# Patient Record
Sex: Female | Born: 1980 | Race: Black or African American | Hispanic: No | Marital: Married | Smoking: Never smoker
Health system: Southern US, Community
[De-identification: ages and names within clinical notes are randomized; demographics above are authoritative.]

## PROBLEM LIST (undated history)

## (undated) ENCOUNTER — Inpatient Hospital Stay (HOSPITAL_COMMUNITY): Payer: Self-pay

## (undated) DIAGNOSIS — IMO0001 Reserved for inherently not codable concepts without codable children: Secondary | ICD-10-CM

## (undated) DIAGNOSIS — Z789 Other specified health status: Secondary | ICD-10-CM

## (undated) HISTORY — PX: NO PAST SURGERIES: SHX2092

---

## 2005-07-20 ENCOUNTER — Other Ambulatory Visit: Admission: RE | Admit: 2005-07-20 | Discharge: 2005-07-20 | Payer: Self-pay | Admitting: Obstetrics and Gynecology

## 2006-08-04 ENCOUNTER — Other Ambulatory Visit: Admission: RE | Admit: 2006-08-04 | Discharge: 2006-08-04 | Payer: Self-pay | Admitting: Obstetrics and Gynecology

## 2007-08-10 ENCOUNTER — Other Ambulatory Visit: Admission: RE | Admit: 2007-08-10 | Discharge: 2007-08-10 | Payer: Self-pay | Admitting: Obstetrics and Gynecology

## 2008-08-01 ENCOUNTER — Inpatient Hospital Stay (HOSPITAL_COMMUNITY): Admission: AD | Admit: 2008-08-01 | Discharge: 2008-08-01 | Payer: Self-pay | Admitting: Obstetrics and Gynecology

## 2008-08-29 ENCOUNTER — Other Ambulatory Visit: Admission: RE | Admit: 2008-08-29 | Discharge: 2008-08-29 | Payer: Self-pay | Admitting: Obstetrics and Gynecology

## 2009-02-22 ENCOUNTER — Inpatient Hospital Stay (HOSPITAL_COMMUNITY): Admission: AD | Admit: 2009-02-22 | Discharge: 2009-02-25 | Payer: Self-pay | Admitting: Obstetrics and Gynecology

## 2009-10-13 ENCOUNTER — Ambulatory Visit: Admission: RE | Admit: 2009-10-13 | Discharge: 2009-10-13 | Payer: Self-pay | Admitting: Certified Nurse Midwife

## 2010-11-06 ENCOUNTER — Inpatient Hospital Stay (HOSPITAL_COMMUNITY): Admission: AD | Admit: 2010-11-06 | Discharge: 2010-11-08 | Payer: Self-pay | Admitting: Certified Nurse Midwife

## 2011-02-23 LAB — CBC
HCT: 31.9 % — ABNORMAL LOW (ref 36.0–46.0)
Hemoglobin: 10.7 g/dL — ABNORMAL LOW (ref 12.0–15.0)
Hemoglobin: 12.5 g/dL (ref 12.0–15.0)
MCH: 34.6 pg — ABNORMAL HIGH (ref 26.0–34.0)
MCHC: 33.5 g/dL (ref 30.0–36.0)
MCV: 101.8 fL — ABNORMAL HIGH (ref 78.0–100.0)
MCV: 103.3 fL — ABNORMAL HIGH (ref 78.0–100.0)
Platelets: 147 10*3/uL — ABNORMAL LOW (ref 150–400)
Platelets: 170 10*3/uL (ref 150–400)
RBC: 3.08 MIL/uL — ABNORMAL LOW (ref 3.87–5.11)
RBC: 3.6 MIL/uL — ABNORMAL LOW (ref 3.87–5.11)
RDW: 14.1 % (ref 11.5–15.5)
WBC: 10.3 10*3/uL (ref 4.0–10.5)
WBC: 15.6 10*3/uL — ABNORMAL HIGH (ref 4.0–10.5)

## 2011-02-23 LAB — RH IMMUNE GLOB WKUP(>/=20WKS)(NOT WOMEN'S HOSP): Antibody Screen: POSITIVE

## 2011-02-23 LAB — RPR: RPR Ser Ql: NONREACTIVE

## 2011-02-23 LAB — ABO/RH: ABO/RH(D): B NEG

## 2011-03-25 LAB — CBC
HCT: 30.5 % — ABNORMAL LOW (ref 36.0–46.0)
HCT: 30.7 % — ABNORMAL LOW (ref 36.0–46.0)
HCT: 33 % — ABNORMAL LOW (ref 36.0–46.0)
Hemoglobin: 10.1 g/dL — ABNORMAL LOW (ref 12.0–15.0)
Hemoglobin: 10.5 g/dL — ABNORMAL LOW (ref 12.0–15.0)
MCHC: 33.2 g/dL (ref 30.0–36.0)
MCHC: 34.1 g/dL (ref 30.0–36.0)
MCV: 101.7 fL — ABNORMAL HIGH (ref 78.0–100.0)
MCV: 100.4 fL — ABNORMAL HIGH (ref 78.0–100.0)
Platelets: 160 10*3/uL (ref 150–400)
RDW: 14.6 % (ref 11.5–15.5)
RDW: 14.5 % (ref 11.5–15.5)
WBC: 9 10*3/uL (ref 4.0–10.5)

## 2011-03-25 LAB — DIFFERENTIAL
Basophils Relative: 0 % (ref 0–1)
Lymphs Abs: 3.7 10*3/uL (ref 0.7–4.0)
Monocytes Relative: 7 % (ref 3–12)
Neutro Abs: 8.1 10*3/uL — ABNORMAL HIGH (ref 1.7–7.7)
Neutrophils Relative %: 62 % (ref 43–77)

## 2011-03-25 LAB — URIC ACID
Uric Acid, Serum: 5 mg/dL (ref 2.4–7.0)
Uric Acid, Serum: 4.9 mg/dL (ref 2.4–7.0)

## 2011-03-25 LAB — COMPREHENSIVE METABOLIC PANEL
AST: 39 U/L — ABNORMAL HIGH (ref 0–37)
Albumin: 2.8 g/dL — ABNORMAL LOW (ref 3.5–5.2)
Alkaline Phosphatase: 90 U/L (ref 39–117)
Alkaline Phosphatase: 130 U/L — ABNORMAL HIGH (ref 39–117)
BUN: 10 mg/dL (ref 6–23)
BUN: 10 mg/dL (ref 6–23)
BUN: 11 mg/dL (ref 6–23)
Calcium: 8.6 mg/dL (ref 8.4–10.5)
Calcium: 8.4 mg/dL (ref 8.4–10.5)
Chloride: 108 mEq/L (ref 96–112)
Creatinine, Ser: 0.93 mg/dL (ref 0.4–1.2)
Glucose, Bld: 72 mg/dL (ref 70–99)
Glucose, Bld: 76 mg/dL (ref 70–99)
Potassium: 3.5 mEq/L (ref 3.5–5.1)
Total Bilirubin: 0.5 mg/dL (ref 0.3–1.2)
Total Protein: 4.7 g/dL — ABNORMAL LOW (ref 6.0–8.3)
Total Protein: 4.8 g/dL — ABNORMAL LOW (ref 6.0–8.3)

## 2011-03-25 LAB — RPR: RPR Ser Ql: NONREACTIVE

## 2011-07-25 LAB — HEPATITIS B SURFACE ANTIGEN: Hepatitis B Surface Ag: NEGATIVE

## 2011-07-25 LAB — RPR: RPR: NONREACTIVE

## 2011-07-27 LAB — GC/CHLAMYDIA PROBE AMP, GENITAL: Gonorrhea: NEGATIVE

## 2011-12-14 NOTE — L&D Delivery Note (Signed)
Delivery Note   AROM at 0636 - clear / 9cm vtx / ROT at +2 Complete dilation at 0652 Onset of pushing at 0654 FHR second stage baseline 150 - variables to 80 nadir with ctx / pushing  Anesthesia: local for repair only with 1% xylocaine  Delivery of a viable female at 0708 by CNM in LOA position.  Delivery of head between ctx at 0707 then cessation of maternal efforts - McRoberts / coached maternal pushing to delivery shoulders Nuchal Cord tight - slid down body at birth. Passage of meconium at time of birth Cord double clamped after cessation of pulsation, cut by FOB Cord blood sample collected. Apgars 7-9  Placenta delivered 0714 intact with 3 VC.  Placenta for disposal. Uterine tone firm bleeding small  1st laceration asymmetrical right perineal identified.  Repair 4-0 subcuticular repair Est. Blood Loss (mL): 300-350  Complications: none  Mom to postpartum.  Baby to nursery-stable.  Marlinda Mike 02/17/2012, 7:31 AM

## 2011-12-16 ENCOUNTER — Other Ambulatory Visit: Payer: Self-pay | Admitting: Certified Nurse Midwife

## 2011-12-16 DIAGNOSIS — IMO0002 Reserved for concepts with insufficient information to code with codable children: Secondary | ICD-10-CM

## 2011-12-16 MED ORDER — RHO D IMMUNE GLOBULIN 300 MCG IM INJ: 300.0000 ug | INJECTION | Freq: Once | INTRAMUSCULAR | Status: DC

## 2011-12-24 ENCOUNTER — Ambulatory Visit (HOSPITAL_COMMUNITY)
Admission: RE | Admit: 2011-12-24 | Discharge: 2011-12-24 | Disposition: A | Discharge status: Home / Self Care | Payer: Medicaid Other | Source: Ambulatory Visit | Attending: Certified Nurse Midwife | Admitting: Certified Nurse Midwife

## 2011-12-24 ENCOUNTER — Inpatient Hospital Stay (HOSPITAL_COMMUNITY)
Admission: AD | Admit: 2011-12-24 | Discharge: 2011-12-24 | Disposition: A | Discharge status: Home Health | Payer: Medicaid Other | Source: Ambulatory Visit | Attending: Obstetrics | Admitting: Obstetrics

## 2011-12-24 ENCOUNTER — Encounter (HOSPITAL_COMMUNITY): Payer: Self-pay

## 2011-12-24 DIAGNOSIS — O09299 Supervision of pregnancy with other poor reproductive or obstetric history, unspecified trimester: Secondary | ICD-10-CM | POA: Insufficient documentation

## 2011-12-24 DIAGNOSIS — IMO0002 Reserved for concepts with insufficient information to code with codable children: Secondary | ICD-10-CM

## 2011-12-24 DIAGNOSIS — Z2989 Encounter for other specified prophylactic measures: Secondary | ICD-10-CM | POA: Insufficient documentation

## 2011-12-24 DIAGNOSIS — O358XX0 Maternal care for other (suspected) fetal abnormality and damage, not applicable or unspecified: Secondary | ICD-10-CM | POA: Insufficient documentation

## 2011-12-24 DIAGNOSIS — Z298 Encounter for other specified prophylactic measures: Secondary | ICD-10-CM | POA: Insufficient documentation

## 2011-12-24 LAB — RH IG WORKUP (INCLUDES ABO/RH)
ABO/RH(D): B NEG
Antibody Screen: NEGATIVE
Gestational Age(Wks): 36
Unit division: 0

## 2011-12-24 LAB — CBC
HCT: 34 % — ABNORMAL LOW (ref 36.0–46.0)
Hemoglobin: 11.5 g/dL — ABNORMAL LOW (ref 12.0–15.0)
MCH: 32.7 pg (ref 26.0–34.0)
MCHC: 33.8 g/dL (ref 30.0–36.0)
MCV: 96.6 fL (ref 78.0–100.0)
Platelets: 189 10*3/uL (ref 150–400)
RBC: 3.52 MIL/uL — ABNORMAL LOW (ref 3.87–5.11)
RDW: 13.9 % (ref 11.5–15.5)
WBC: 9.5 10*3/uL (ref 4.0–10.5)

## 2011-12-24 LAB — RPR: RPR Ser Ql: NONREACTIVE

## 2011-12-24 LAB — GLUCOSE TOLERANCE, 1 HOUR: Glucose, 1 Hour GTT: 96 mg/dL (ref 70–140)

## 2011-12-24 MED ORDER — RHO D IMMUNE GLOBULIN 1500 UNIT/2ML IJ SOLN
300.0000 ug | Freq: Once | INTRAMUSCULAR | Status: AC
  Administered 2011-12-24: 300 ug via INTRAMUSCULAR

## 2011-12-24 NOTE — Progress Notes (Signed)
Genetic Counseling  High-Risk Gestation Note  Appointment Date:  12/24/2011 Referred By: Marlinda Mike, CNM Date of Birth:  [birth year] Attending: Rema Fendt, MD   Molly Olson was seen for genetic counseling because of the previous ultrasound finding of absent nasal bone.   Ultrasound was performed at the time of today's visit. Complete ultrasound results reported separately.  An absent or hypoplastic (undeveloped, or slightly smaller than expected) nasal bone is commonly a normal variation with no associated problems.  This may be a family trait and can be more common in some ethnic groups, such as the African American population.  However, an absent or hypoplastic nasal bone has been shown to increase the chance for Down syndrome in a pregnancy.   We reviewed chromosomes, nondisjunction, and the common features and variable prognosis of Down syndrome. We discussed that her baseline age-related chance for Down syndrome is approximately 1 in 690. Thus, the finding of absent nasal bone would increase the chance for Down syndrome to approximately 1 in 81. We reviewed the available diagnostic option of amniocentesis.  A risk of 1 in 200-300 was given for amniocentesis, with the primary concern being a risk for preterm labor and delivery.  We discussed another type of screening test, noninvasive prenatal testing (NIPT), which utilizes cell free fetal DNA found in the maternal circulation. This test is not diagnostic for chromosome conditions, but can provide information regarding the presence or absence of extra fetal DNA for chromosomes 13, 18 and 21. The reported detection rate is greater than 99% for Trisomy 21, greater than 97% for Trisomy 18, and is approximately 80% (8 out of 10) for Trisomy 13. The false positive rate is thought to be less than 1% for any of these conditions. We discussed the risks, limitations, and benefits of each. After thoughtful consideration of these options, Mrs. Eberwein  elected to have ultrasound only, and declined amniocentesis and cell free DNA testing.  She understands that ultrasound cannot rule out all birth defects or genetic syndromes.  The patient was advised of this limitation and states she still does not want diagnostic testing at this time.    Both family histories were reviewed and found to be contributory a niece with sickle cell trait, reportedly inherited from her father who is not related to the patient.  Mrs. Bahr was provided with written information regarding sickle cell anemia (SCA) including the carrier frequency and incidence in the African-American population, the availability of carrier testing and prenatal diagnosis if indicated.  In addition, we discussed that hemoglobinopathies are routinely screened for as part of the  newborn screening panel. Mrs. Ghanem has had a normal hemoglobin electrophoresis, indicating that she does not have sickle cell trait. The family history was otherwise unremarkable for birth defects, mental retardation, recurrent pregnancy loss, or known genetic conditions. Without further information regarding the provided family history, an accurate genetic risk cannot be calculated. Further genetic counseling is warranted if more information is obtained.  Mrs. Brott denied exposure to environmental toxins or chemical agents. She denied the use of alcohol, tobacco or street drugs. She denied significant viral illnesses during the course of her pregnancy. Her medical and surgical histories were noncontributory.   I counseled Mrs. Dorcus Gravatt for approximately 30 minutes regarding the above risks and available options.     Quinn Plowman, MS,  Certified Genetic Counselor 12/24/2011

## 2011-12-24 NOTE — Progress Notes (Signed)
Ms. Mainor was seen for ultrasound appointment today.  Please see [facility] report for details.

## 2012-01-03 ENCOUNTER — Other Ambulatory Visit: Payer: Self-pay

## 2012-01-27 ENCOUNTER — Inpatient Hospital Stay (HOSPITAL_COMMUNITY)
Admission: AD | Admit: 2012-01-27 | Discharge: 2012-01-27 | Disposition: A | Discharge status: Home / Self Care | Payer: Medicaid Other | Source: Ambulatory Visit | Attending: Obstetrics & Gynecology | Admitting: Obstetrics & Gynecology

## 2012-01-27 NOTE — Plan of Care (Signed)
Patient was for an outpatient GBS only. Not to be seen in MAU.

## 2012-02-17 ENCOUNTER — Encounter (HOSPITAL_COMMUNITY): Payer: Self-pay | Admitting: Family Medicine

## 2012-02-17 ENCOUNTER — Inpatient Hospital Stay (HOSPITAL_COMMUNITY)
Admission: AD | Admit: 2012-02-17 | Discharge: 2012-02-18 | DRG: 775 | Disposition: A | Discharge status: Home Health | Payer: Medicaid Other | Source: Ambulatory Visit | Attending: Obstetrics and Gynecology | Admitting: Obstetrics and Gynecology

## 2012-02-17 LAB — CBC
HCT: 36.2 % (ref 36.0–46.0)
Hemoglobin: 12.2 g/dL (ref 12.0–15.0)
MCH: 32.5 pg (ref 26.0–34.0)
MCHC: 33.7 g/dL (ref 30.0–36.0)
MCV: 96.5 fL (ref 78.0–100.0)
Platelets: 160 10*3/uL (ref 150–400)
RBC: 3.75 MIL/uL — ABNORMAL LOW (ref 3.87–5.11)
RDW: 14.6 % (ref 11.5–15.5)
WBC: 13 10*3/uL — ABNORMAL HIGH (ref 4.0–10.5)

## 2012-02-17 LAB — RPR: RPR Ser Ql: NONREACTIVE

## 2012-02-17 LAB — GC/CHLAMYDIA PROBE AMP, GENITAL

## 2012-02-17 MED ORDER — LANOLIN HYDROUS EX OINT: TOPICAL_OINTMENT | CUTANEOUS | Status: DC | PRN

## 2012-02-17 MED ORDER — DIBUCAINE 1 % RE OINT: 1.0000 "application " | TOPICAL_OINTMENT | RECTAL | Status: DC | PRN

## 2012-02-17 MED ORDER — IBUPROFEN 600 MG PO TABS
600.0000 mg | ORAL_TABLET | Freq: Four times a day (QID) | ORAL | Status: DC | PRN
  Administered 2012-02-17: 600 mg via ORAL
  Filled 2012-02-17: qty 1

## 2012-02-17 MED ORDER — LACTATED RINGERS IV SOLN: 500.0000 mL | INTRAVENOUS | Status: DC | PRN

## 2012-02-17 MED ORDER — CITRIC ACID-SODIUM CITRATE 334-500 MG/5ML PO SOLN: 30.0000 mL | ORAL | Status: DC | PRN

## 2012-02-17 MED ORDER — ACETAMINOPHEN 325 MG PO TABS: 650.0000 mg | ORAL_TABLET | ORAL | Status: DC | PRN

## 2012-02-17 MED ORDER — PRENATAL MULTIVITAMIN CH
1.0000 | ORAL_TABLET | Freq: Every day | ORAL | Status: DC
  Administered 2012-02-17 – 2012-02-18 (×2): 1 via ORAL
  Filled 2012-02-17 (×2): qty 1

## 2012-02-17 MED ORDER — MISOPROSTOL 200 MCG PO TABS
800.0000 ug | ORAL_TABLET | Freq: Once | ORAL | Status: AC | PRN
  Filled 2012-02-17: qty 4

## 2012-02-17 MED ORDER — BENZOCAINE-MENTHOL 20-0.5 % EX AERO
INHALATION_SPRAY | CUTANEOUS | Status: AC
  Administered 2012-02-17: 11:00:00
  Filled 2012-02-17: qty 56

## 2012-02-17 MED ORDER — LIDOCAINE HCL (PF) 1 % IJ SOLN
30.0000 mL | INTRAMUSCULAR | Status: DC | PRN
  Administered 2012-02-17: 30 mL via SUBCUTANEOUS
  Filled 2012-02-17: qty 30

## 2012-02-17 MED ORDER — RHO D IMMUNE GLOBULIN 1500 UNIT/2ML IJ SOLN
300.0000 ug | Freq: Once | INTRAMUSCULAR | Status: AC
  Administered 2012-02-17: 300 ug via INTRAMUSCULAR
  Filled 2012-02-17: qty 2

## 2012-02-17 MED ORDER — SIMETHICONE 80 MG PO CHEW: 80.0000 mg | CHEWABLE_TABLET | ORAL | Status: DC | PRN

## 2012-02-17 MED ORDER — IBUPROFEN 600 MG PO TABS
600.0000 mg | ORAL_TABLET | Freq: Four times a day (QID) | ORAL | Status: DC
  Administered 2012-02-17 – 2012-02-18 (×5): 600 mg via ORAL
  Filled 2012-02-17 (×5): qty 1

## 2012-02-17 MED ORDER — BENZOCAINE-MENTHOL 20-0.5 % EX AERO: 1.0000 "application " | INHALATION_SPRAY | CUTANEOUS | Status: DC | PRN

## 2012-02-17 MED ORDER — OXYCODONE-ACETAMINOPHEN 5-325 MG PO TABS
1.0000 | ORAL_TABLET | ORAL | Status: DC | PRN
  Administered 2012-02-17 – 2012-02-18 (×4): 1 via ORAL
  Filled 2012-02-17 (×4): qty 1

## 2012-02-17 MED ORDER — WITCH HAZEL-GLYCERIN EX PADS: 1.0000 "application " | MEDICATED_PAD | CUTANEOUS | Status: DC | PRN

## 2012-02-17 NOTE — H&P (Signed)
OB ADMISSION/ HISTORY & PHYSICAL:  Admission Date: 02/17/2012  6:16 AM  Admit Diagnosis: Active Labor  Molly Olson is a 31 y.o. female presenting for onset of labor ~0300 but painful ctx at 0500  Prenatal History: G5P2022   EDC : 02/08/2012, by Last Menstrual Period  Prenatal care at [facility]  Primary provider - Marlinda Mike Prenatal course uncomplicated / noted echogenic focus on fetal heart   Prenatal Labs: ABO, Rh:   B Negative Antibody: NEG (01/11 0932) Rubella:   Immune RPR: NON REACTIVE (01/11 1015)  HBsAg:   Neg HIV: Non-reactive (08/12 0000)  GBS:   Negative 1 hr Glucola : NL  Medical / Surgical History :  Past medical history: No past medical history on file.   Past surgical history: No past surgical history on file.   Family History: No family history on file.   Social History:  does not have a smoking history on file. She does not have any smokeless tobacco history on file. Her alcohol and drug histories not on file.   Allergies: Review of patient's allergies indicates no known allergies.    Current Medications at time of admission:  Prenatal daily  Review of Systems: Some ctx around 0300 but went back to sleep Onset of regular ctx and bloody show at 0500 ? Gush of fluid around 0530 - more bloody show  Physical Exam:   BP 124/78 General: uncomfortable with ctx Heart: RRR Lungs: labored / tachypnea  - breathing hard with ctx Abdomen: gravid / non-tender Extremities: 1+ edema Genitalia / VE: 9cm / 90% / vtx / ROT / +2 / Fore[place] present - AROM with clear fluid  FHR: 150 baseline / moderate variability / variable decels to nadir 90 TOCO: ctx every 2 minutes     Assessment: 40 weeks active labor Negative GBS  Plan:  Admit Expectant management Anticipate imminent SVB  Marlinda Mike 02/17/2012, 6:25 AM

## 2012-02-17 NOTE — Progress Notes (Signed)
NSVD of a viable female.  

## 2012-02-17 NOTE — Progress Notes (Signed)
UR chart review completed.  

## 2012-02-18 LAB — RH IG WORKUP (INCLUDES ABO/RH)
ABO/RH(D): B NEG
Gestational Age(Wks): 41.2

## 2012-02-18 MED ORDER — IBUPROFEN 600 MG PO TABS: 600.0000 mg | ORAL_TABLET | Freq: Four times a day (QID) | ORAL | Status: AC

## 2012-02-18 MED ORDER — OXYCODONE-ACETAMINOPHEN 5-325 MG PO TABS: 1.0000 | ORAL_TABLET | ORAL | Status: AC | PRN

## 2012-02-18 MED ORDER — DOCUSATE SODIUM 100 MG PO CAPS: 100.0000 mg | ORAL_CAPSULE | Freq: Two times a day (BID) | ORAL | Status: AC

## 2012-02-18 NOTE — Progress Notes (Signed)
Patient ID: Molly Olson, female   DOB: 1981/12/12, 31 y.o.   MRN: [id number]  PPD # 2  Subjective: Pt reports feeling well, sore, and eager for early d/c home/ Pain controlled with motrin and percocet Tolerating po/ Voiding without problems/ No n/v Bleeding is light/ Newborn info:  Information for the patient's newborn:  [name], [name] [[id number]]  female Feeding: breast    Objective:  VS: Blood pressure 106/71, pulse 69, temperature 98.2 F (36.8 C), temperature source Oral, resp. rate 16    Basename 02/17/12 0920  WBC 13.0*  HGB 12.2  HCT 36.2  PLT 160    Blood type: --/--/B NEG (03/07 0920) Rubella: Immune (08/12 0000)    Physical Exam:  General: A & O x 3  alert, cooperative and no distress CV: Regular rate and rhythm Resp: clear Abdomen: soft, nontender, normal bowel sounds Uterine Fundus: firm, below umbilicus, nontender Perineum: healing with good reapproximation and mod edema Lochia: moderate Ext: edema trace and Homans sign is negative, no sign of DVT    A/P: PPD # 2/ G5P3023/ S/P:spontaneous vaginal Doing well and stable for discharge home RX: Ibuprofen 600mg  po Q 6 hrs prn pain #30 Refill x 1 Colace 100 mg 1 - 2 po QD prn constipation #30 Ref x 1 Percocet 5/325 1 to 2 po Q 4 hrs prn pain #15 No refill WOB/GYN booklet given F/U next week with Wiliam Ke, CNM for stitch removal Routine pp visit in 6wks   Demetrius Revel, MSN, [facility] 02/18/2012, 10:40 AM

## 2012-02-18 NOTE — Discharge Summary (Signed)
Obstetric Discharge Summary Reason for Admission: onset of labor and Term pregnancy at 40wks Prenatal Procedures: NST and ultrasound Intrapartum Procedures: spontaneous vaginal delivery Postpartum Procedures: none Complications-Operative and Postpartum: 2nd degree perineal laceration Hemoglobin  Date Value Range Status  02/17/2012 12.2  12.0-15.0 (g/dL) Final     HCT  Date Value Range Status  02/17/2012 36.2  36.0-46.0 (%) Final    Discharge Diagnoses: Term Pregnancy-delivered  Discharge Information: Date: 02/18/2012 Activity: pelvic rest and increase activity daily Diet: routine Medications: PNV, Ibuprofen, Colace and Percocet Condition: stable Instructions: refer to practice specific booklet and return to ofice on 02/22/12 for removal of vaginal repair stitch Discharge to: home   Newborn Data: Live born female on 02/17/12 Birth Weight: 8 lb 4.3 oz (3750 g) APGAR: 7, 9  Home with mother.  Molly Olson K 02/18/2012, 10:50 AM

## 2012-12-21 IMAGING — US US OB DETAIL+14 WK
1 series · 14 of 28 positions shown · non-contrast
Comparison: none

[Series 1: us ob detail+14 wk · 0.21mm/px · 14 of 82 slices shown]
[im 4/82]
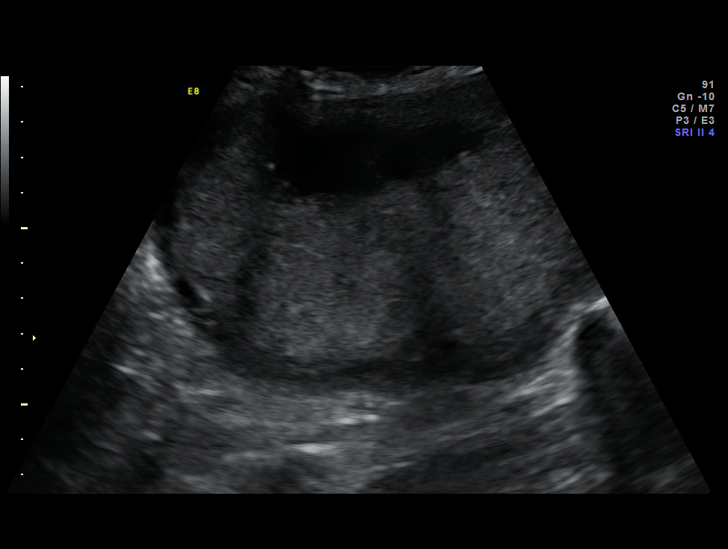
[im 10/82]
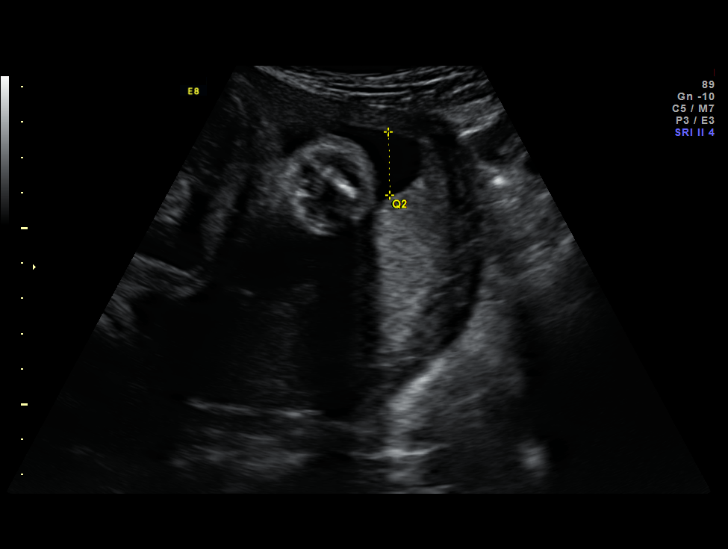
[im 16/82]
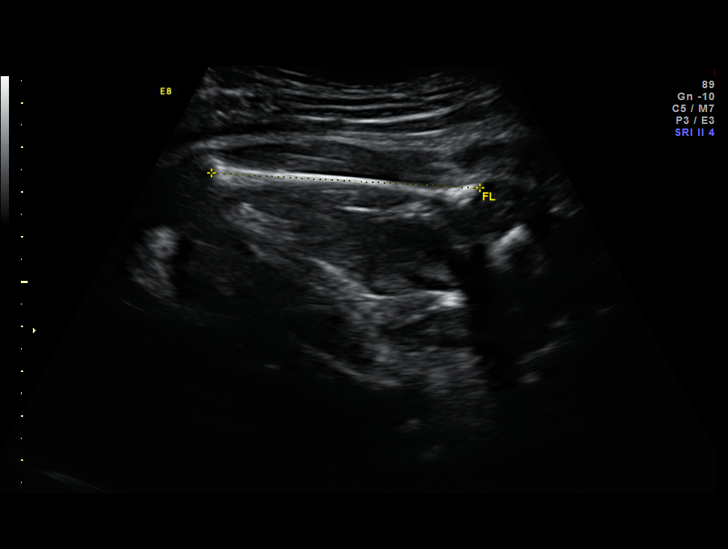
[im 22/82]
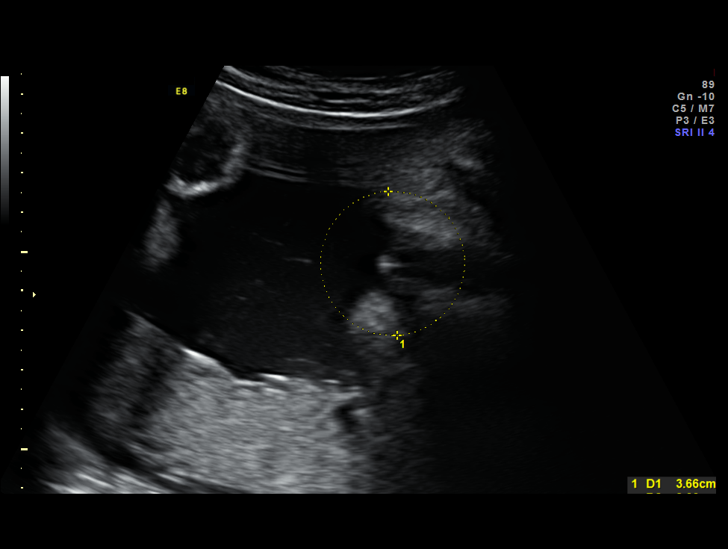
[im 28/82]
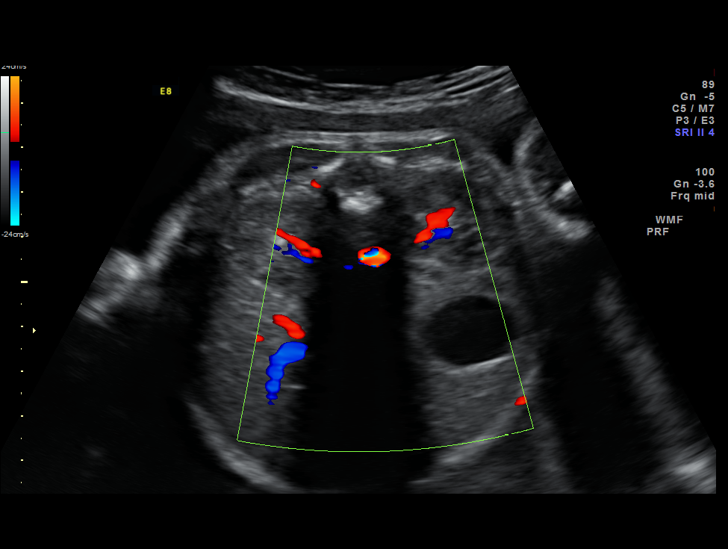
[im 34/82]
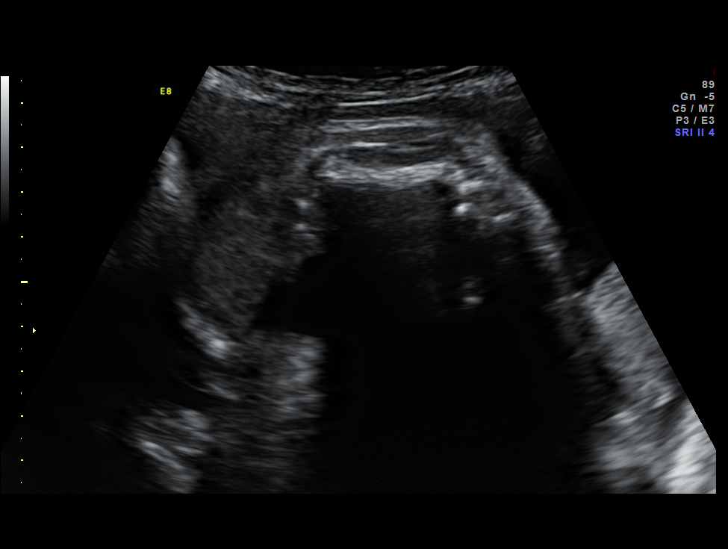
[im 40/82]
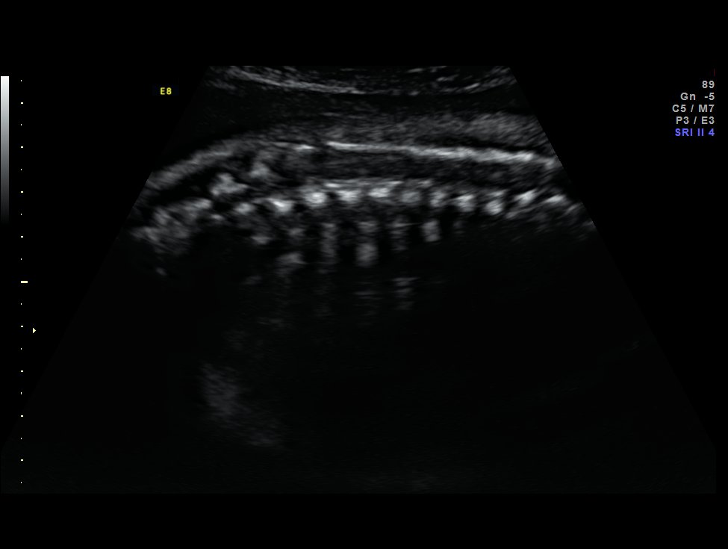
[im 46/82]
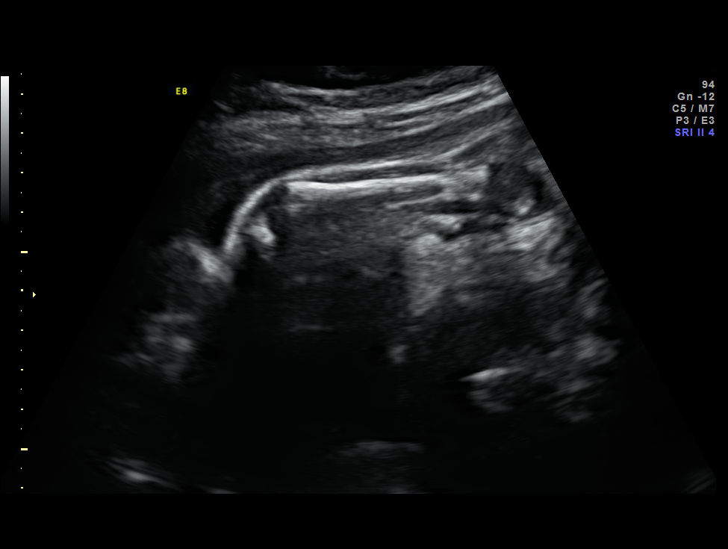
[im 52/82]
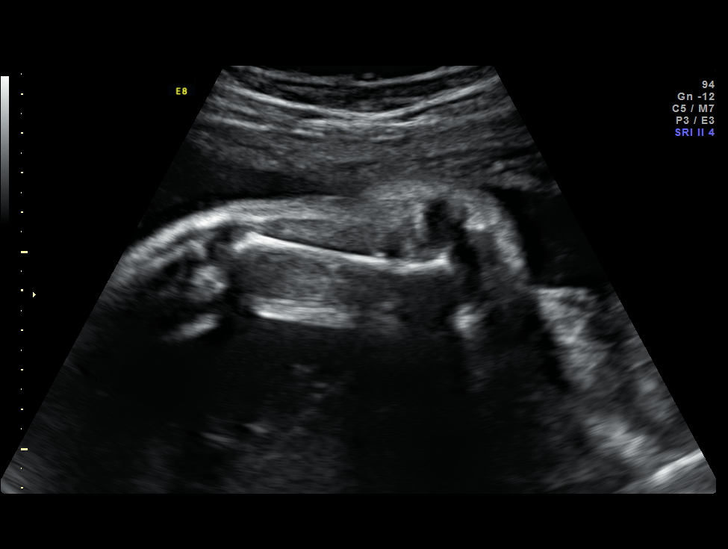
[im 58/82]
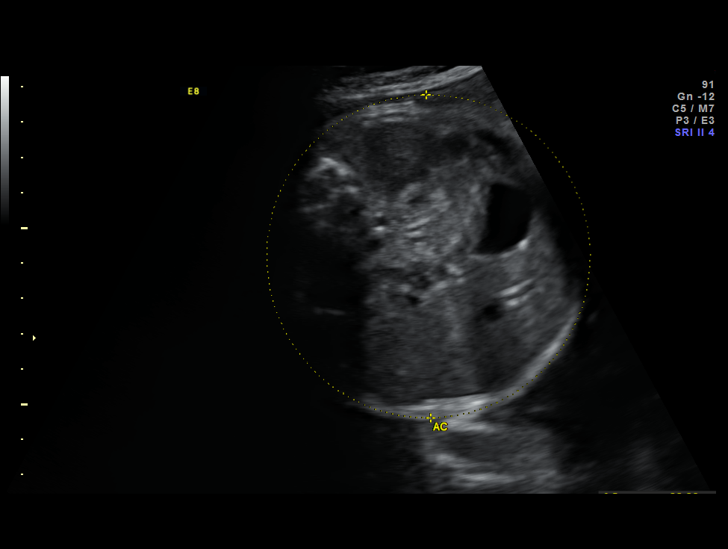
[im 64/82]
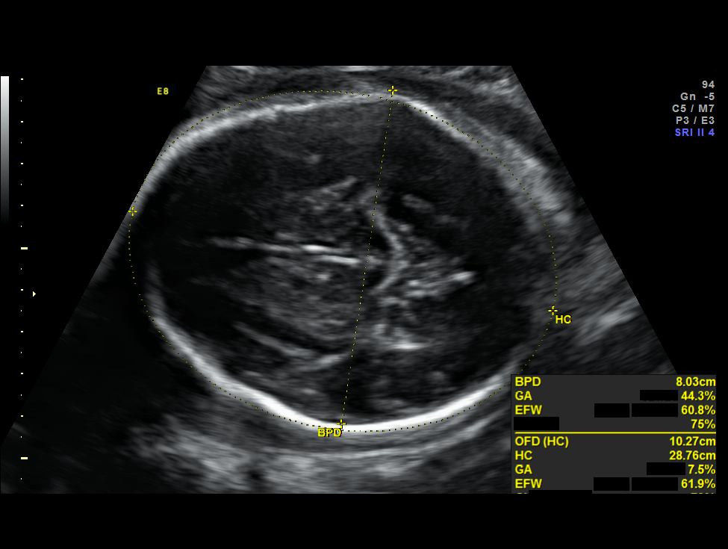
[im 70/82]
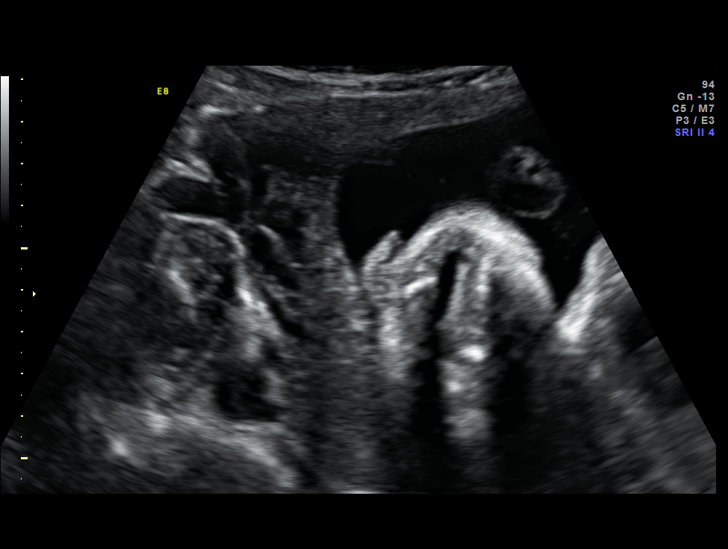
[im 76/82]
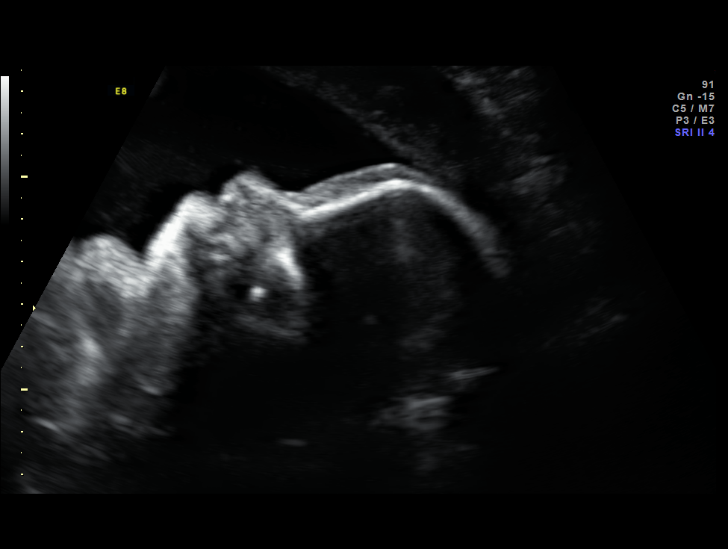
[im 82/82]
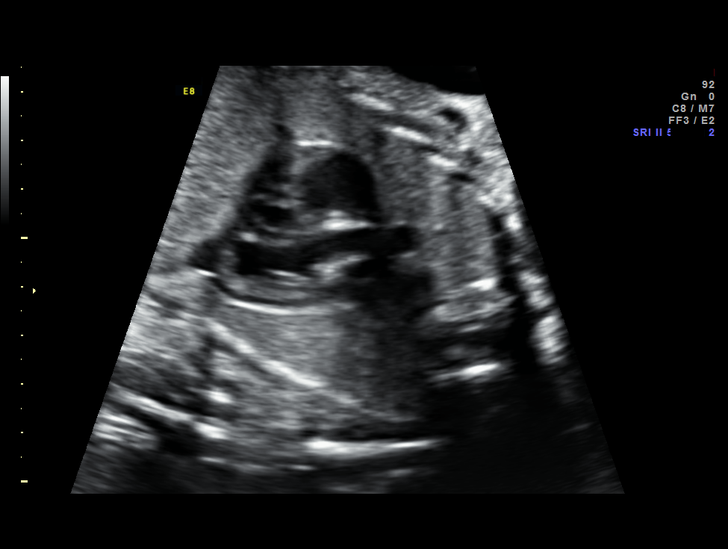

[14 of 28 positions shown; findings below may reference images not displayed]

Canned report from images found in remote index.

Refer to host system for actual result text.

## 2013-12-13 NOTE — L&D Delivery Note (Signed)
Delivery Note  First Stage: Labor onset: 05/27/2014 2300 Augmentation : AROM Analgesia /Anesthesia intrapartum: none AROM at 0918  Second Stage: Complete dilation at 1007 Onset of pushing at 1007 FHR second stage 125  Delivery of a viable female at 141040 by CNM in LOA position Tight nuchal cord x 1 / unable to reduce on perineum - delivered through without difficulty Cord double clamped after cessation of pulsation, cut by FOB Cord blood sample collected   Arterial cord blood sample not collected  Third Stage: Placenta delivered via Tomasa BlaseSchultz intact with 3 VC @ 1044 Placenta disposition: L&D Uterine tone firm / bleeding moderate  1st degree perineal laceration identified  Anesthesia for repair: 1% lidocaine Repair 4.0 vicryl  Est. Blood Loss (mL): 200 mL  Complications: Shoulder dystocia x 2 mins / resolved with McRoberts maneuver and delivery of the anterior shoulder  Mom to postpartum.  Baby to warmer for NRP by RN staff / then to FOB for skin-to-skin care.  Newborn: Birth Weight: 8 lbs 12 oz  Apgar Scores: 3/9 Feeding planned: breast  Raelyn MoraAWSON, ROLITTA, M  MSN, CNM 05/28/2014, 11:13 AM

## 2013-12-26 LAB — OB RESULTS CONSOLE RUBELLA ANTIBODY, IGM: RUBELLA: IMMUNE

## 2013-12-26 LAB — OB RESULTS CONSOLE RPR: RPR: NONREACTIVE

## 2013-12-26 LAB — OB RESULTS CONSOLE ABO/RH: RH Type: NEGATIVE

## 2013-12-26 LAB — OB RESULTS CONSOLE ANTIBODY SCREEN: Antibody Screen: NEGATIVE

## 2013-12-26 LAB — OB RESULTS CONSOLE HEPATITIS B SURFACE ANTIGEN: HEP B S AG: NEGATIVE

## 2013-12-26 LAB — OB RESULTS CONSOLE HIV ANTIBODY (ROUTINE TESTING): HIV: NONREACTIVE

## 2014-04-26 LAB — OB RESULTS CONSOLE GBS: GBS: NEGATIVE

## 2014-05-27 ENCOUNTER — Telehealth (HOSPITAL_COMMUNITY): Payer: Self-pay | Admitting: *Deleted

## 2014-05-27 ENCOUNTER — Encounter (HOSPITAL_COMMUNITY): Payer: Self-pay | Admitting: *Deleted

## 2014-05-27 NOTE — Telephone Encounter (Signed)
Preadmission screen  

## 2014-05-28 ENCOUNTER — Encounter (HOSPITAL_COMMUNITY): Payer: Self-pay | Admitting: Obstetrics and Gynecology

## 2014-05-28 ENCOUNTER — Inpatient Hospital Stay (HOSPITAL_COMMUNITY)
Admission: AD | Admit: 2014-05-28 | Discharge: 2014-05-29 | DRG: 775 | Disposition: A | Discharge status: Home / Self Care | Payer: Medicaid Other | Source: Ambulatory Visit | Attending: Obstetrics and Gynecology | Admitting: Obstetrics and Gynecology

## 2014-05-28 DIAGNOSIS — O480 Post-term pregnancy: Principal | ICD-10-CM | POA: Diagnosis present

## 2014-05-28 DIAGNOSIS — Z833 Family history of diabetes mellitus: Secondary | ICD-10-CM

## 2014-05-28 DIAGNOSIS — O360990 Maternal care for other rhesus isoimmunization, unspecified trimester, not applicable or unspecified: Secondary | ICD-10-CM | POA: Diagnosis present

## 2014-05-28 DIAGNOSIS — Z8249 Family history of ischemic heart disease and other diseases of the circulatory system: Secondary | ICD-10-CM

## 2014-05-28 DIAGNOSIS — IMO0001 Reserved for inherently not codable concepts without codable children: Secondary | ICD-10-CM

## 2014-05-28 HISTORY — DX: Reserved for inherently not codable concepts without codable children: IMO0001

## 2014-05-28 HISTORY — DX: Other specified health status: Z78.9

## 2014-05-28 LAB — CBC
HEMATOCRIT: 37.9 % (ref 36.0–46.0)
HEMOGLOBIN: 13.2 g/dL (ref 12.0–15.0)
MCH: 33.7 pg (ref 26.0–34.0)
MCHC: 34.8 g/dL (ref 30.0–36.0)
MCV: 96.7 fL (ref 78.0–100.0)
Platelets: 186 10*3/uL (ref 150–400)
RBC: 3.92 MIL/uL (ref 3.87–5.11)
RDW: 14.4 % (ref 11.5–15.5)
WBC: 11.4 10*3/uL — ABNORMAL HIGH (ref 4.0–10.5)

## 2014-05-28 LAB — RPR

## 2014-05-28 MED ORDER — OXYTOCIN 10 UNIT/ML IJ SOLN
10.0000 [IU] | Freq: Once | INTRAMUSCULAR | Status: AC | PRN
  Administered 2014-05-28: 10 [IU] via INTRAMUSCULAR
  Filled 2014-05-28: qty 1

## 2014-05-28 MED ORDER — LIDOCAINE HCL (PF) 1 % IJ SOLN
30.0000 mL | INTRAMUSCULAR | Status: DC | PRN
  Administered 2014-05-28: 30 mL via SUBCUTANEOUS
  Filled 2014-05-28: qty 30

## 2014-05-28 MED ORDER — BENZOCAINE-MENTHOL 20-0.5 % EX AERO: 1.0000 "application " | INHALATION_SPRAY | CUTANEOUS | Status: DC | PRN

## 2014-05-28 MED ORDER — SIMETHICONE 80 MG PO CHEW: 80.0000 mg | CHEWABLE_TABLET | ORAL | Status: DC | PRN

## 2014-05-28 MED ORDER — CITRIC ACID-SODIUM CITRATE 334-500 MG/5ML PO SOLN: 30.0000 mL | ORAL | Status: DC | PRN

## 2014-05-28 MED ORDER — WITCH HAZEL-GLYCERIN EX PADS: 1.0000 "application " | MEDICATED_PAD | CUTANEOUS | Status: DC | PRN

## 2014-05-28 MED ORDER — ONDANSETRON HCL 4 MG PO TABS: 4.0000 mg | ORAL_TABLET | ORAL | Status: DC | PRN

## 2014-05-28 MED ORDER — SENNOSIDES-DOCUSATE SODIUM 8.6-50 MG PO TABS
2.0000 | ORAL_TABLET | ORAL | Status: DC
  Administered 2014-05-29: 2 via ORAL
  Filled 2014-05-28: qty 2

## 2014-05-28 MED ORDER — ONDANSETRON HCL 4 MG/2ML IJ SOLN: 4.0000 mg | INTRAMUSCULAR | Status: DC | PRN

## 2014-05-28 MED ORDER — IBUPROFEN 600 MG PO TABS: 600.0000 mg | ORAL_TABLET | Freq: Four times a day (QID) | ORAL | Status: DC | PRN

## 2014-05-28 MED ORDER — LACTATED RINGERS IV SOLN: 500.0000 mL | INTRAVENOUS | Status: DC | PRN

## 2014-05-28 MED ORDER — OXYCODONE-ACETAMINOPHEN 5-325 MG PO TABS: 1.0000 | ORAL_TABLET | ORAL | Status: DC | PRN

## 2014-05-28 MED ORDER — TETANUS-DIPHTH-ACELL PERTUSSIS 5-2.5-18.5 LF-MCG/0.5 IM SUSP: 0.5000 mL | Freq: Once | INTRAMUSCULAR | Status: DC

## 2014-05-28 MED ORDER — DIPHENHYDRAMINE HCL 25 MG PO CAPS: 25.0000 mg | ORAL_CAPSULE | Freq: Four times a day (QID) | ORAL | Status: DC | PRN

## 2014-05-28 MED ORDER — ACETAMINOPHEN 325 MG PO TABS: 650.0000 mg | ORAL_TABLET | ORAL | Status: DC | PRN

## 2014-05-28 MED ORDER — IBUPROFEN 600 MG PO TABS
600.0000 mg | ORAL_TABLET | Freq: Four times a day (QID) | ORAL | Status: DC
  Administered 2014-05-28 – 2014-05-29 (×6): 600 mg via ORAL
  Filled 2014-05-28 (×6): qty 1

## 2014-05-28 MED ORDER — PRENATAL MULTIVITAMIN CH
1.0000 | ORAL_TABLET | Freq: Every day | ORAL | Status: DC
  Administered 2014-05-29: 1 via ORAL
  Filled 2014-05-28: qty 1

## 2014-05-28 MED ORDER — DIBUCAINE 1 % RE OINT: 1.0000 "application " | TOPICAL_OINTMENT | RECTAL | Status: DC | PRN

## 2014-05-28 NOTE — MAU Note (Signed)
UC's since last night, regular since 0600

## 2014-05-28 NOTE — Progress Notes (Signed)
Patient ID: Molly Olson, female   DOB: 1981/11/21, 33 y.o.   MRN: [id number] Called to room by spouse reporting patient has the urge to push.  S: Doing well, pain well-controlled with breathing techniques and frequent position changes.  (+) pelvic pressure with every contraction.   Ceasar Mons: Filed Vitals:   05/28/14 0818 05/28/14 0819 05/28/14 0858 05/28/14 0900  BP:  118/76 114/62   Pulse:  72 89   Temp: 98 F (36.7 C)     TempSrc: Oral     Resp: 20   20  Height: 5\' 3"  (1.6 m)   5\' 3"  (1.6 m)  Weight: 78.472 kg (173 lb)   78.472 kg (173 lb)     FHT:  FHR: 145 bpm, variability: moderate,  accelerations:  Present,  decelerations:  Present variable decels with every contraction UC:   regular, every 2-3 minutes SVE:   Dilation: Lip/rim Effacement (%): 100 Station: 0 Exam by:: Raelyn Moraolitta Dawson, CNM   A / P: Spontaneous labor, progressing normally  Fetal Wellbeing:  Category II Pain Control:  Labor support without medications  Anticipated MOD:  NSVD  Kenard GowerAWSON, ROLITTA, M, MSN, CNM 05/28/2014, 9:40 AM

## 2014-05-28 NOTE — H&P (Signed)
OB ADMISSION/ HISTORY & PHYSICAL:  Admission Date: 05/28/2014  8:06 AM  Admit Diagnosis: Postterm  Molly Olson is a 33 y.o. female presenting for active labor@ 41.2 wks.  Prenatal History: [id number]   EDC : 05/19/2014, by Last Menstrual Period  [facility] since 19.[redacted] weeks gestation Primary [facility]: Marlinda Mikeanya Bailey, CNM  Prenatal course complicated by Asymptomatic Bacteriuria  Prenatal Labs: ABO, Rh: B (01/14 0000)  Antibody: Negative (01/14 0000) Rubella: Immune (01/14 0000)  RPR: Nonreactive (01/14 0000)  HBsAg: Negative (01/14 0000)  HIV: Non-reactive (01/14 0000)  GBS: Negative (05/15 0000)  1 hr Glucola : 98 mg/dL   Medical / Surgical History :  Past medical history:  Past Medical History  Diagnosis Date  . Active labor at term 05/28/2014  . Medical history non-contributory      Past surgical history:  Past Surgical History  Procedure Laterality Date  . No past surgeries       Family History:  Family History  Problem Relation Age of Onset  . Diabetes Mother   . Hypertension Father   . Heart disease Maternal Uncle   . Diabetes Maternal Grandmother   . Diabetes Paternal Grandmother      Social History:  reports that she has never smoked. She does not have any smokeless tobacco history on file. She reports that she does not drink alcohol or use illicit drugs.   Allergies: Latex    Current Medications at time of admission:  Prescriptions prior to admission  Medication Sig Dispense Refill  . Prenatal Vit-Fe Fumarate-FA (PRENATAL MULTIVITAMIN) TABS Take 1 tablet by mouth daily.          Review of Systems: Review of Systems  Constitutional: Negative.   HENT: Negative.   Eyes: Negative.   Cardiovascular: Negative.   Gastrointestinal: Negative.        Strong UC's  Genitourinary: Negative.   Musculoskeletal: Positive for back pain.  Skin: Negative.   Neurological: Negative.    Endo/Heme/Allergies: Negative.   Psychiatric/Behavioral: Negative.          Physical Exam:  Dilation: 8 Effacement (%): 90 Station: 0 Exam by:: dawson @EDLMPVITALS @  General: Heart: Lungs: Abdomen: Extremities: Genitalia / VE: FHR: 150 / moderate varability / accels present / variable decels TOCO: every 2-3 mins, strong to palpation  Labs:     Recent Labs  05/28/14 0900  WBC 11.4*  HGB 13.2  HCT 37.9  PLT 186     Assessment:  33 y.o. [id number] at 5840w2d  1. Labor: active 2. Fetal Wellbeing: Category 2  3. Pain Control: none 4. GBS: Negative   Plan:  1. Admit to BS 2. Routine L&D orders 3. AROM    Dr Ernestina PennaFogleman notified of admission / plan of care    Kenard GowerDAWSON, ROLITTA, M, MSN, CNM 05/28/2014, 9:24 AM

## 2014-05-29 ENCOUNTER — Inpatient Hospital Stay (HOSPITAL_COMMUNITY): Admission: RE | Admit: 2014-05-29 | Payer: Medicaid Other | Source: Ambulatory Visit

## 2014-05-29 LAB — CBC
HCT: 36.2 % (ref 36.0–46.0)
Hemoglobin: 12.1 g/dL (ref 12.0–15.0)
MCH: 32.8 pg (ref 26.0–34.0)
MCHC: 33.4 g/dL (ref 30.0–36.0)
MCV: 98.1 fL (ref 78.0–100.0)
Platelets: 179 10*3/uL (ref 150–400)
RBC: 3.69 MIL/uL — AB (ref 3.87–5.11)
RDW: 14.8 % (ref 11.5–15.5)
WBC: 15.1 10*3/uL — ABNORMAL HIGH (ref 4.0–10.5)

## 2014-05-29 MED ORDER — IBUPROFEN 600 MG PO TABS: 600.0000 mg | ORAL_TABLET | Freq: Four times a day (QID) | ORAL | Status: AC

## 2014-05-29 NOTE — Discharge Summary (Signed)
Obstetric Discharge Summary Reason for Admission: onset of labor Prenatal Procedures: ultrasound Intrapartum Procedures: spontaneous vaginal delivery Postpartum Procedures: none Complications-Operative and Postpartum: 1st degree perineal laceration Hemoglobin  Date Value Ref Range Status  05/29/2014 12.1  12.0 - 15.0 g/dL Final     HCT  Date Value Ref Range Status  05/29/2014 36.2  36.0 - 46.0 % Final    Physical Exam:  General: alert and cooperative Lochia: appropriate Uterine Fundus: firm Incision: healing well, no significant drainage, no dehiscence, no significant erythema DVT Evaluation: No evidence of DVT seen on physical exam. Negative Homan's sign. No cords or calf tenderness. No significant calf/ankle edema.  Discharge Diagnoses: Term Pregnancy-delivered  Discharge Information: Date: 05/29/2014 Activity: pelvic rest Diet: routine Medications: PNV and Ibuprofen Condition: stable Instructions: refer to practice specific booklet Discharge to: home Follow-up Information   Follow up with Molly Olson. Schedule an appointment as soon as possible for a visit in 6 weeks.   Specialty:  Obstetrics and Gynecology   Contact information:   Molly Olson       Newborn Data: Live born female on 05/28/14 Birth Weight: 8 lb 12 oz (3969 g) APGAR: 3, 9  Home with mother.  Molly Olson 05/29/2014, 6:09 PM

## 2014-05-29 NOTE — Progress Notes (Addendum)
PPD #2- SVD  Subjective:   Reports feeling well, desires early discharge Tolerating po/ No nausea or vomiting Bleeding is light Pain controlled with Motrin Up ad lib / ambulatory / voiding without problems Newborn: breastfeeding  / Circumcision: planning outpatient   Objective:   VS: VS:  Filed Vitals:   05/28/14 1230 05/28/14 1330 05/28/14 1730 05/29/14 0523  BP: 126/76 119/72 112/62 99/65  Pulse: 71 90 78 73  Temp: 97.4 F (36.3 C) 97.8 F (36.6 C) 98.2 F (36.8 C) 98.3 F (36.8 C)  TempSrc: Oral Oral Oral Oral  Resp: 18 18 18 18   Height:      Weight:        LABS:  Recent Labs  05/28/14 0900 05/29/14 0610  WBC 11.4* 15.1*  HGB 13.2 12.1  PLT 186 179   Blood type: B/Negative/-- (01/14 0000) Rubella: Immune (01/14 0000)                I&O: Intake/Output     06/16 0701 - 06/17 0700 06/17 0701 - 06/18 0700   Blood 200    Total Output 200     Net -200            Physical Exam: Alert and oriented X3 Abdomen: soft, non-tender, non-distended  Fundus: firm, non-tender, U-2 Perineum: Well approximated, no significant erythema, edema, or drainage; healing well. Lochia: small Extremities: no edema, no calf pain or tenderness    Assessment: PPD # 2 G6P4024/ S/P:spontaneous vaginal Rh negative-newborn negative Doing well - stable    Plan: Possible discharge home later today if baby d/c RX's:  Ibuprofen 600mg  po Q 6 hrs prn pain #30 Refill x 0 Routine pp visit in [employer] booklet given    Donette LarryBHAMBRI, [name], N MSN, CNM 05/29/2014, 11:39 AM

## 2014-05-29 NOTE — Lactation Note (Signed)
This note was copied from the chart of Molly Olson. Lactation Consultation Note  Patient Name: Molly Sallee ProvencalHeather Manternach ZOXWR'UToday's Date: 05/29/2014 Reason for consult: Initial assessment  Infant rooting upon entering room.  GA 41.2; BW 8 lbs, 12 oz; shoulder dystocia and nuchal cord; apgars 3 & 9 (per MD note).  Infant has breastfed x8 (10-30 min) + 1 attempt; voids-3; stools-2 since birth; 3526 hrs old.  Mom epxerienced breastfeeding x3 prior children (10-11 months each).  Educated mom on feeding cues, cluster feeding, and size of infant's stomach per day of life.  Mom stated left side is the "harder" side, LC offered to assist with latching on left.  Mom hand expressed colostrum prior to attempting to latch with football hold but was not holding infant in same arm and not supporting breast which resulted in a shallow latch and mom verbalizing the pain.  LC verbally reviewed sandwiching of breast and asymetrical latching technique and football hold.  Mom then independently latched infant using appropriate hold and attained depth with latch on left side using football hold.  Mom stated the current latch was the best it had felt on the left side and verbalized the reasoning (depth achieved) for the non-painful latch.  LS-8; swallows heard with compressions.  Infant remained latched and in a consistent sucking pattern for 15 minutes.  "Breastfeeding Latch on 1,2,3" sheet given.  Comfort gels given for c/o of slight tenderness and explained how to use.  Mom has a DEBP at home.  Lactation brochure given and informed of support group and outpatient services.  Encouraged to call after discharge for questions as needed.    Maternal Data Formula Feeding for Exclusion: No Infant to breast within first hour of birth: Yes Does the patient have breastfeeding experience prior to this delivery?: Yes  Feeding Feeding Type: Breast Fed Length of feed: 20 min  LATCH Score/Interventions Latch: Grasps breast easily, tongue  down, lips flanged, rhythmical sucking. Intervention(s): Assist with latch;Breast compression  Audible Swallowing: A few with stimulation Intervention(s): Skin to skin  Type of Nipple: Everted at rest and after stimulation  Comfort (Breast/Nipple): Filling, red/small blisters or bruises, mild/mod discomfort  Problem noted: Mild/Moderate discomfort Interventions (Mild/moderate discomfort): Comfort gels  Hold (Positioning): No assistance needed to correctly position infant at breast. Intervention(s): Breastfeeding basics reviewed;Support Pillows;Position options;Skin to skin  LATCH Score: 8  Lactation Tools Discussed/Used Tools: Comfort gels WIC Program: No   Consult Status Consult Status: Follow-up Date: 05/30/14 Follow-up type: In-patient    Lendon KaVann, Shontia Gillooly Walker 05/29/2014, 1:40 PM

## 2014-10-14 ENCOUNTER — Encounter (HOSPITAL_COMMUNITY): Payer: Self-pay | Admitting: Obstetrics and Gynecology
